# Patient Record
Sex: Female | Born: 1940 | Race: White | Hispanic: No | Marital: Married
Health system: Southern US, Community
[De-identification: ages and names within clinical notes are randomized; demographics above are authoritative.]

## PROBLEM LIST (undated history)

## (undated) DIAGNOSIS — Z91018 Allergy to other foods: Secondary | ICD-10-CM

## (undated) HISTORY — PX: BREAST BIOPSY: SHX20

---

## 2008-11-27 HISTORY — PX: BREAST BIOPSY: SHX20

## 2017-12-10 HISTORY — PX: BREAST BIOPSY: SHX20

## 2019-11-10 ENCOUNTER — Other Ambulatory Visit: Payer: Self-pay | Admitting: Internal Medicine

## 2019-11-10 DIAGNOSIS — Z1231 Encounter for screening mammogram for malignant neoplasm of breast: Secondary | ICD-10-CM

## 2019-11-30 ENCOUNTER — Ambulatory Visit
Admission: RE | Admit: 2019-11-30 | Discharge: 2019-11-30 | Disposition: A | Discharge status: Home / Self Care | Payer: Medicare PPO | Source: Ambulatory Visit | Attending: Internal Medicine | Admitting: Internal Medicine

## 2019-11-30 ENCOUNTER — Other Ambulatory Visit: Payer: Self-pay

## 2019-11-30 DIAGNOSIS — Z1231 Encounter for screening mammogram for malignant neoplasm of breast: Secondary | ICD-10-CM

## 2020-01-09 ENCOUNTER — Telehealth: Payer: Medicare PPO | Admitting: Family

## 2020-01-09 DIAGNOSIS — J069 Acute upper respiratory infection, unspecified: Secondary | ICD-10-CM | POA: Diagnosis not present

## 2020-01-09 MED ORDER — FLUTICASONE PROPIONATE 50 MCG/ACT NA SUSP: 2.0000 | Freq: Every day | NASAL | 6 refills | Status: AC

## 2020-01-09 NOTE — Progress Notes (Signed)

## 2020-10-30 ENCOUNTER — Other Ambulatory Visit: Payer: Self-pay | Admitting: Internal Medicine

## 2020-10-30 DIAGNOSIS — Z1231 Encounter for screening mammogram for malignant neoplasm of breast: Secondary | ICD-10-CM

## 2020-12-12 ENCOUNTER — Ambulatory Visit
Admission: RE | Admit: 2020-12-12 | Discharge: 2020-12-12 | Disposition: A | Discharge status: Home / Self Care | Payer: Medicare PPO | Source: Ambulatory Visit | Attending: Internal Medicine | Admitting: Internal Medicine

## 2020-12-12 ENCOUNTER — Other Ambulatory Visit: Payer: Self-pay

## 2020-12-12 DIAGNOSIS — Z1231 Encounter for screening mammogram for malignant neoplasm of breast: Secondary | ICD-10-CM | POA: Insufficient documentation

## 2021-11-06 IMAGING — MG MM DIGITAL SCREENING BILAT W/ TOMO AND CAD
8 series · 8 of 24 positions shown · non-contrast
Comparison: Previous exam(s).

CLINICAL DATA: Screening.

EXAM:
DIGITAL SCREENING BILATERAL MAMMOGRAM WITH TOMOSYNTHESIS AND CAD
TECHNIQUE: Bilateral screening digital craniocaudal and mediolateral oblique
mammograms were obtained. Bilateral screening digital breast
tomosynthesis was performed. The images were evaluated with
computer-aided detection.

[R CC synth-2D]
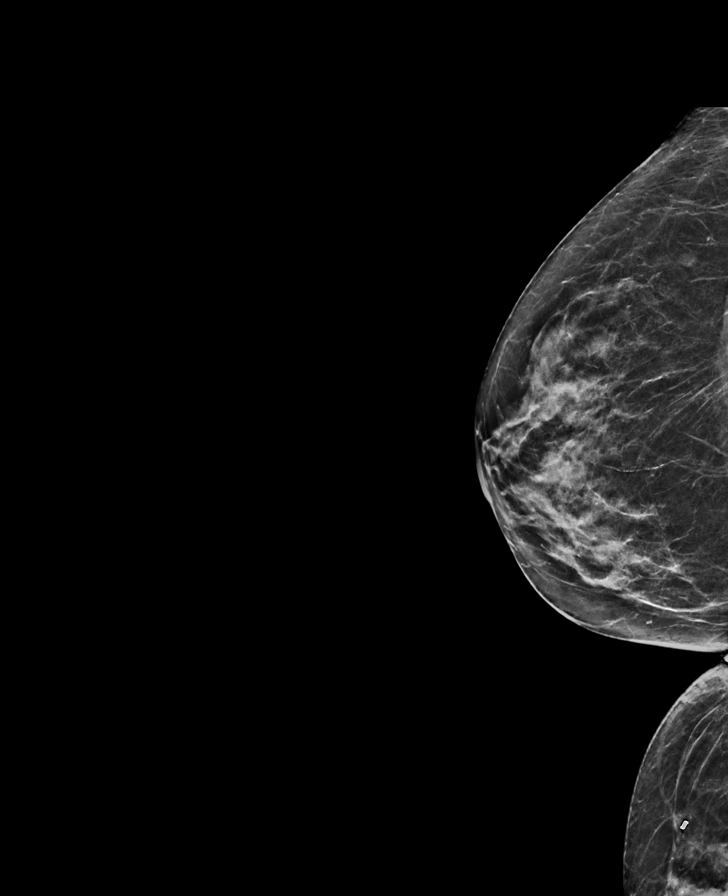

[L MLO synth-2D]
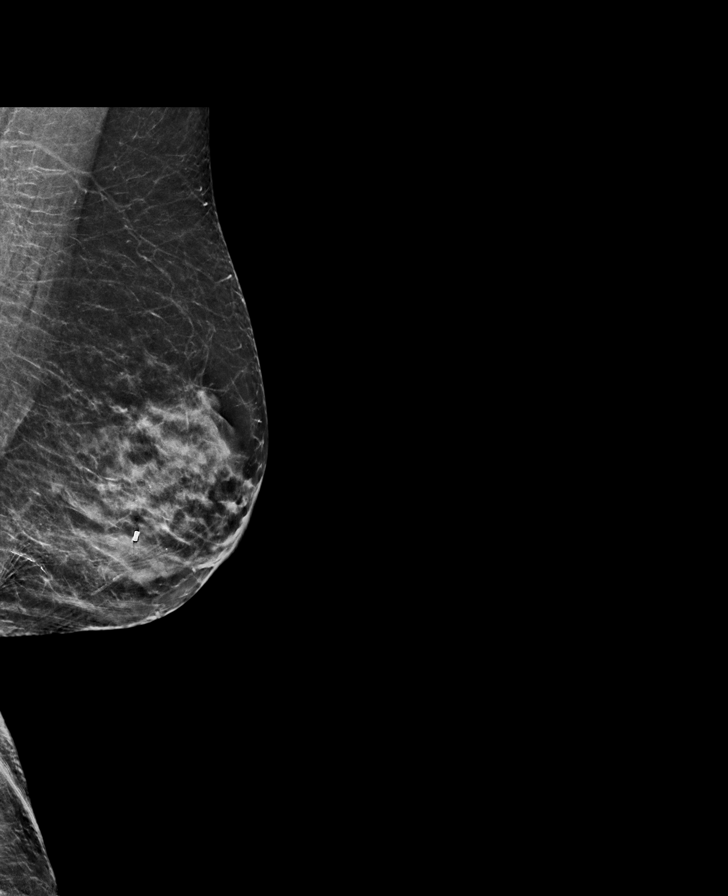

[R MLO synth-2D]
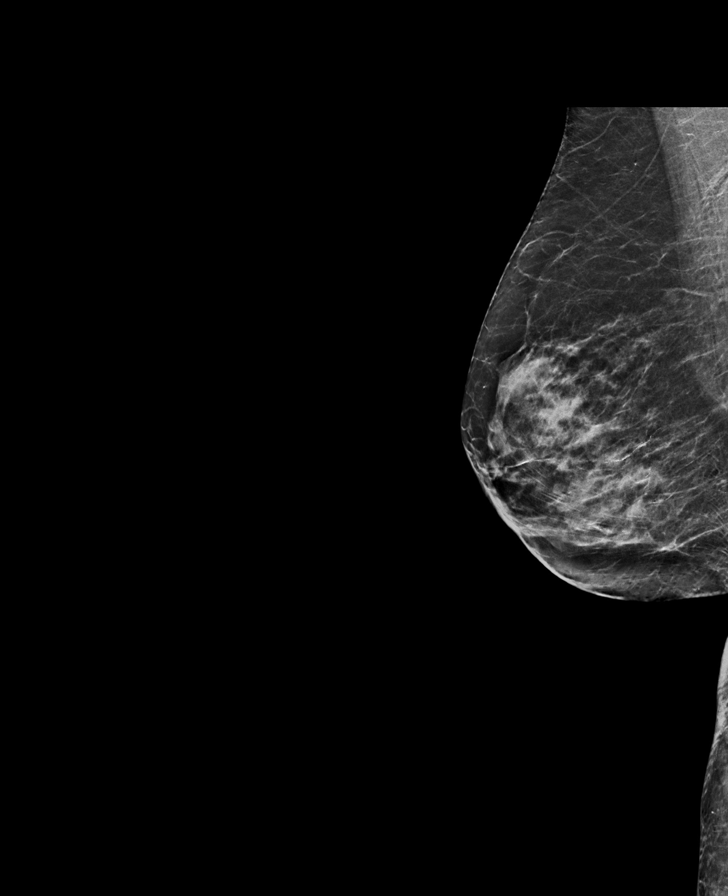

[L CC synth-2D]
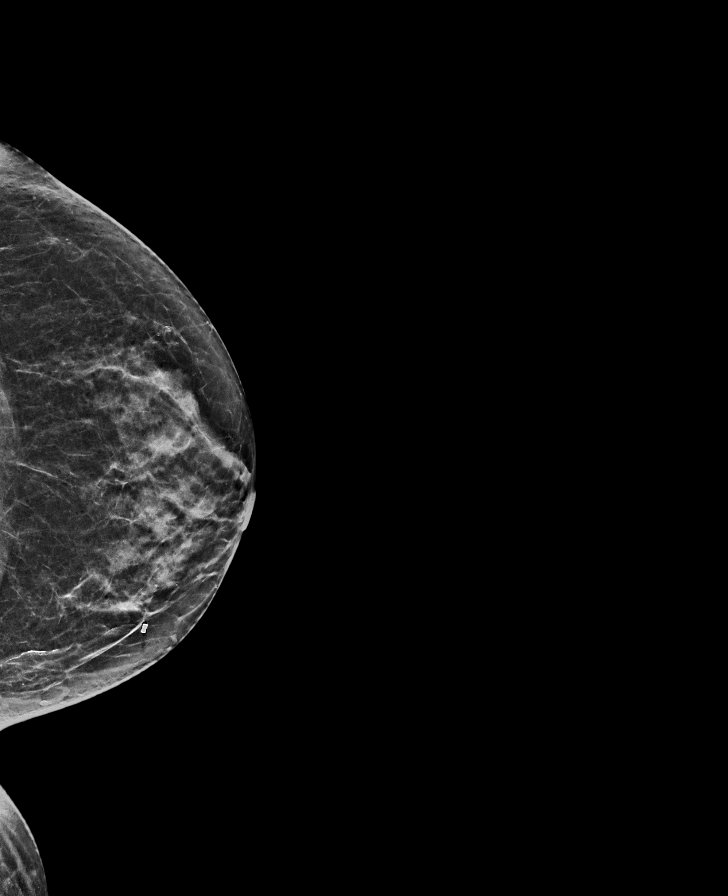

[L MLO tomo · tomo slice 31/62.0]
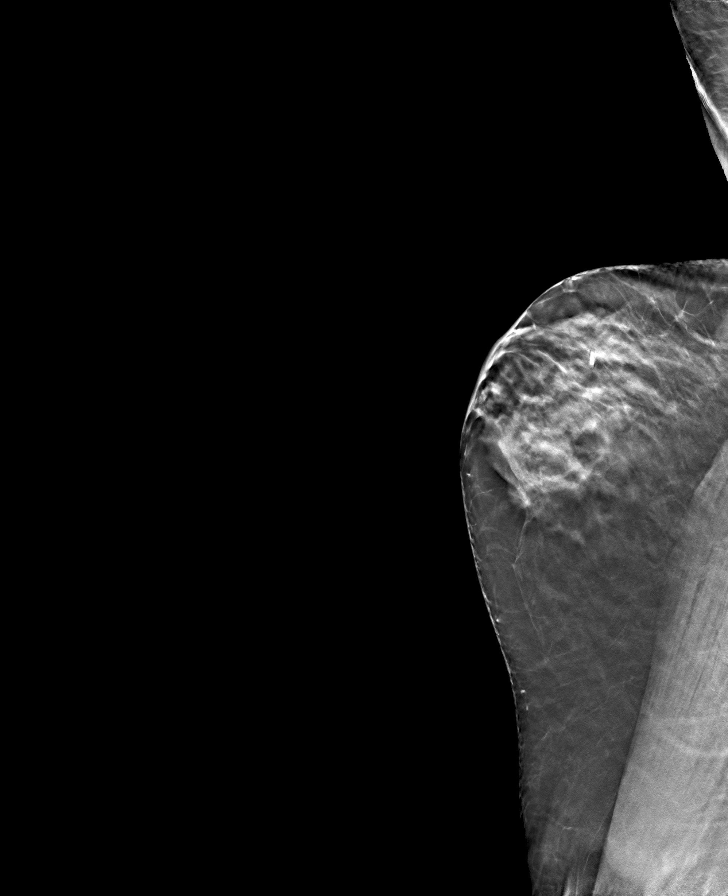

[R CC tomo · tomo slice 27/54.0]
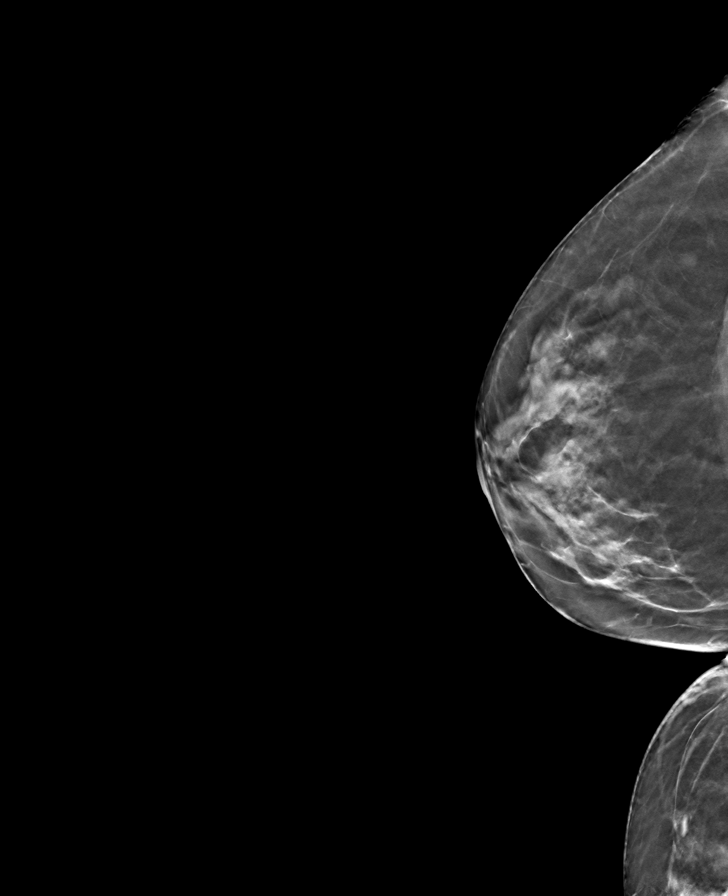

[L CC tomo · tomo slice 29/58.0]
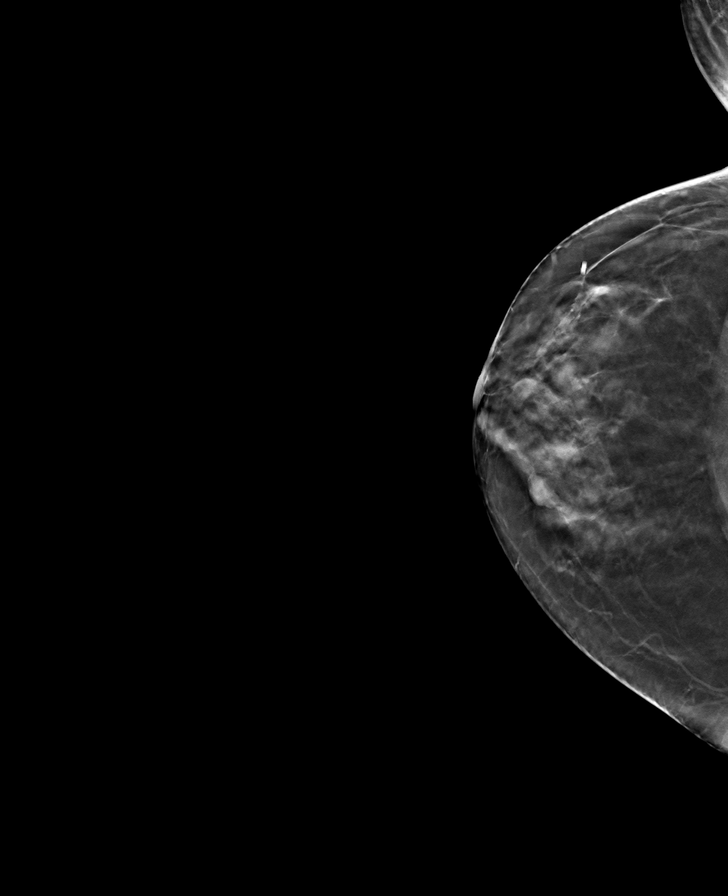

[R MLO tomo · tomo slice 29/58.0]
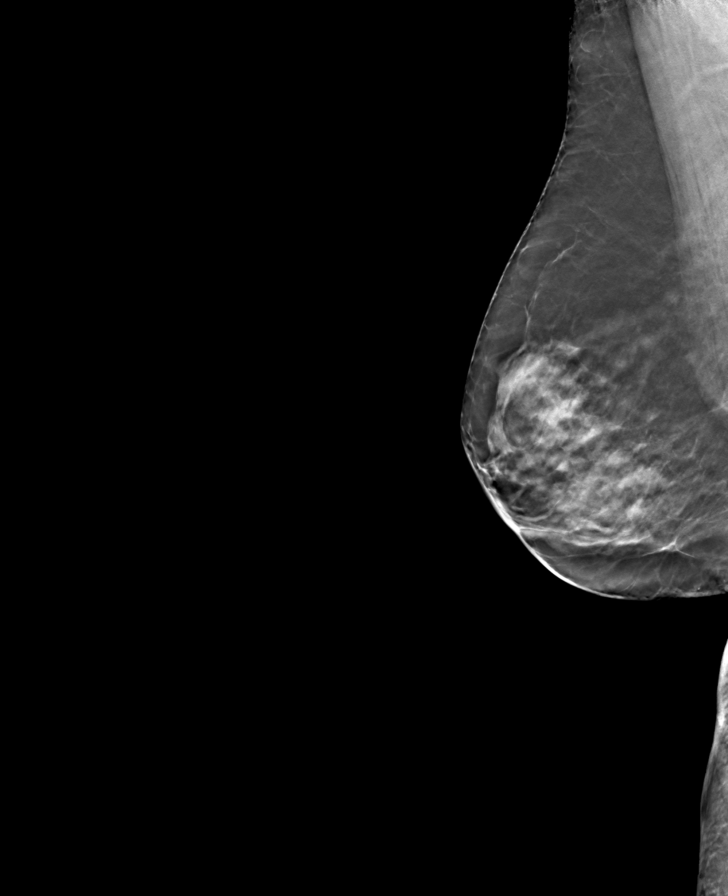

[8 of 24 positions shown; findings below may reference images not displayed]

ACR Breast Density Category c: The breast tissue is heterogeneously
dense, which may obscure small masses.
FINDINGS: There are no findings suspicious for malignancy.
IMPRESSION: No mammographic evidence of malignancy. A result letter of this
screening mammogram will be mailed directly to the patient.

RECOMMENDATION:
Screening mammogram in one year. (Code:Q3-W-BC3)

BI-RADS CATEGORY  1: Negative.

## 2021-12-14 ENCOUNTER — Other Ambulatory Visit: Payer: Self-pay | Admitting: Internal Medicine

## 2021-12-14 DIAGNOSIS — Z1231 Encounter for screening mammogram for malignant neoplasm of breast: Secondary | ICD-10-CM

## 2022-01-23 ENCOUNTER — Ambulatory Visit
Admission: RE | Admit: 2022-01-23 | Discharge: 2022-01-23 | Disposition: A | Discharge status: Home / Self Care | Payer: Medicare PPO | Source: Ambulatory Visit | Attending: Internal Medicine | Admitting: Internal Medicine

## 2022-01-23 DIAGNOSIS — Z1231 Encounter for screening mammogram for malignant neoplasm of breast: Secondary | ICD-10-CM | POA: Diagnosis present

## 2022-03-21 ENCOUNTER — Ambulatory Visit: Admit: 2022-03-21 | Payer: Medicare PPO | Admitting: Gastroenterology

## 2022-03-21 SURGERY — COLONOSCOPY
Anesthesia: General

## 2022-05-04 ENCOUNTER — Emergency Department: Payer: Medicare PPO

## 2022-05-04 ENCOUNTER — Other Ambulatory Visit: Payer: Self-pay

## 2022-05-04 ENCOUNTER — Encounter: Payer: Self-pay | Admitting: Emergency Medicine

## 2022-05-04 DIAGNOSIS — X58XXXA Exposure to other specified factors, initial encounter: Secondary | ICD-10-CM | POA: Insufficient documentation

## 2022-05-04 DIAGNOSIS — H538 Other visual disturbances: Secondary | ICD-10-CM | POA: Diagnosis present

## 2022-05-04 DIAGNOSIS — S0501XA Injury of conjunctiva and corneal abrasion without foreign body, right eye, initial encounter: Secondary | ICD-10-CM | POA: Insufficient documentation

## 2022-05-04 DIAGNOSIS — E039 Hypothyroidism, unspecified: Secondary | ICD-10-CM | POA: Insufficient documentation

## 2022-05-04 DIAGNOSIS — I10 Essential (primary) hypertension: Secondary | ICD-10-CM | POA: Insufficient documentation

## 2022-05-04 NOTE — ED Triage Notes (Signed)
Pt reports she was getting ready to go to bed, pt reports she suddenly she noticed a "gray bar on her right eye" Pt reports she closed her her and she noticed a bright light after image of the gray bar, pt called EMT from her facility and was encourage to come. Pt at present reports the gray bar has diminished but continues to to feel something is there, feels like her eye is not moving smoothly. Pt also reports BP was elevated when EMT checked her BP at facility. Pt talks in complete sentences no respiratory distress noted.

## 2022-05-04 NOTE — ED Notes (Signed)
Pt discussed with Dr. Quentin Cornwall.  No new orders or protocols at this time.

## 2022-05-05 ENCOUNTER — Emergency Department
Admission: EM | Admit: 2022-05-05 | Discharge: 2022-05-05 | Disposition: A | Discharge status: Home / Self Care | Payer: Medicare PPO | Attending: Emergency Medicine | Admitting: Emergency Medicine

## 2022-05-05 DIAGNOSIS — S0501XA Injury of conjunctiva and corneal abrasion without foreign body, right eye, initial encounter: Secondary | ICD-10-CM

## 2022-05-05 DIAGNOSIS — H539 Unspecified visual disturbance: Secondary | ICD-10-CM

## 2022-05-05 DIAGNOSIS — I10 Essential (primary) hypertension: Secondary | ICD-10-CM

## 2022-05-05 HISTORY — DX: Allergy to other foods: Z91.018

## 2022-05-05 LAB — CBC WITH DIFFERENTIAL/PLATELET
Abs Immature Granulocytes: 0.01 10*3/uL (ref 0.00–0.07)
Basophils Absolute: 0.1 10*3/uL (ref 0.0–0.1)
Basophils Relative: 1 %
Eosinophils Absolute: 0.1 10*3/uL (ref 0.0–0.5)
Eosinophils Relative: 2 %
HCT: 40.8 % (ref 36.0–46.0)
Hemoglobin: 13.6 g/dL (ref 12.0–15.0)
Immature Granulocytes: 0 %
Lymphocytes Relative: 40 %
Lymphs Abs: 2.5 10*3/uL (ref 0.7–4.0)
MCH: 31.8 pg (ref 26.0–34.0)
MCHC: 33.3 g/dL (ref 30.0–36.0)
MCV: 95.3 fL (ref 80.0–100.0)
Monocytes Absolute: 0.5 10*3/uL (ref 0.1–1.0)
Monocytes Relative: 8 %
Neutro Abs: 3.1 10*3/uL (ref 1.7–7.7)
Neutrophils Relative %: 49 %
Platelets: 248 10*3/uL (ref 150–400)
RBC: 4.28 MIL/uL (ref 3.87–5.11)
RDW: 12.6 % (ref 11.5–15.5)
WBC: 6.2 10*3/uL (ref 4.0–10.5)
nRBC: 0 % (ref 0.0–0.2)

## 2022-05-05 LAB — BASIC METABOLIC PANEL
Anion gap: 7 (ref 5–15)
BUN: 28 mg/dL — ABNORMAL HIGH (ref 8–23)
CO2: 26 mmol/L (ref 22–32)
Calcium: 9.3 mg/dL (ref 8.9–10.3)
Chloride: 105 mmol/L (ref 98–111)
Creatinine, Ser: 0.74 mg/dL (ref 0.44–1.00)
GFR, Estimated: >60 mL/min (ref >60)
Glucose, Bld: 96 mg/dL (ref 70–99)
Potassium: 3.7 mmol/L (ref 3.5–5.1)
Sodium: 138 mmol/L (ref 135–145)

## 2022-05-05 MED ORDER — POLYMYXIN B-TRIMETHOPRIM 10000-0.1 UNIT/ML-% OP SOLN: 1.0000 [drp] | Freq: Four times a day (QID) | OPHTHALMIC | 0 refills | Status: AC

## 2022-05-05 MED ORDER — FLUORESCEIN SODIUM 1 MG OP STRP
1.0000 | ORAL_STRIP | Freq: Once | OPHTHALMIC | Status: AC
  Administered 2022-05-05: 1 via OPHTHALMIC
  Filled 2022-05-05: qty 1

## 2022-05-05 MED ORDER — TETRACAINE HCL 0.5 % OP SOLN
2.0000 [drp] | Freq: Once | OPHTHALMIC | Status: AC
  Administered 2022-05-05: 2 [drp] via OPHTHALMIC
  Filled 2022-05-05: qty 4

## 2022-05-05 NOTE — Discharge Instructions (Addendum)
I recommend follow-up with your ophthalmologist on Monday.

## 2022-05-05 NOTE — ED Provider Notes (Signed)
[facility] Provider Note    Event Date/Time   First MD Initiated Contact with Patient 05/05/22 [phone]     (approximate)   History   Eye Problem   HPI  Sierra Sawyer is a 82 y.o. female with history of hypothyroidism who presents to the emergency department with concerns for changes in her vision in her right eye that occurred tonight around 9:15 PM right before going to bed.  States she just got up from using the bathroom when she suddenly had a "gray bar" across the center of her vision in her right eye only.  She states when she closed her eyes that this seemed to be more vibrant.  She states she was able to see through the gray bar but it appeared blurry.  There was no vision loss or eye pain.  She states this only lasted 15 to 20 seconds and then resolved.  She does wear glasses.  She does not wear contacts.  She states that afterwards she felt like she had a fullness in the right eye whenever she closes it which is abnormal for her.  She does not think that she injured her eye or rubbed it during this episode.  She denies any headache or head injury.  She states she became concerned for retinal detachment after googling it.  She was also concern for possible stroke.  She denies any numbness, tingling, weakness, other speech or vision changes.   History provided by patient.    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 Authorizing Provider  fluticasone (FLONASE) 50 MCG/ACT nasal spray Place 2 sprays into both nostrils daily. 01/09/20   Sharion Balloon, FNP    Physical Exam   Triage Vital Signs: ED Triage Vitals [05/04/22 2226]  Enc Vitals Group     BP (!) 170/81      Pulse Rate 79     Resp 16     Temp 98.5 F (36.9 C)     Temp Source Oral     SpO2 100 %     Weight 124 lb (56.2 kg)     Height 4\' 11"  (1.499 m)     Head Circumference      Peak Flow      Pain Score 0     Pain Loc      Pain Edu?      Excl. in [place]?     Most recent vital signs: Vitals:   05/05/22 0300 05/05/22 0430  BP: (!) 163/79 135/69  Pulse: 66 73  Resp: 18   Temp: 97.9 F (36.6 C) 98.4 F (36.9 C)  SpO2: 100% 96%    CONSTITUTIONAL: Alert, responds appropriately to questions. Well-appearing; well-nourished HEAD: Normocephalic, atraumatic EYES: Conjunctivae clear, pupils appear equal, sclera nonicteric, intraocular pressure of the right eye is 9 mmHg, patient does have fluorescein uptake just inferior lateral to the pupil at about the 7 o'clock position of the right eye, no sign of corneal ulcer.  Extraocular movements intact.  Pupils equal reactive to light bilaterally and no consensual light response.  Funduscopic exam limited due to patient's eyes not currently being dilated but no acute abnormality appreciated.  Normal visual fields  bilaterally.  Please see nursing notes for visual acuity. ENT: normal nose; moist mucous membranes NECK: Supple, normal ROM CARD: RRR; S1 and S2 appreciated RESP: Normal chest excursion without splinting or tachypnea; breath sounds clear and equal bilaterally; no wheezes, no rhonchi, no rales, no hypoxia or respiratory distress, speaking full sentences ABD/GI: Non-distended; soft, non-tender, no rebound, no guarding, no peritoneal signs BACK: The back appears normal EXT: Normal ROM in all joints; no deformity noted, no edema SKIN: Normal color for age and race; warm; no rash on exposed skin NEURO: Moves all extremities equally, normal speech normal sensation, normal gait, no facial asymmetry PSYCH: The patient's mood and manner are appropriate.   ED Results / Procedures / Treatments   LABS: (all labs ordered are listed, but only abnormal  results are displayed) Labs Reviewed  BASIC METABOLIC PANEL - Abnormal; Notable for the following components:      Result Value   BUN 28 (*)    All other components within normal limits  CBC WITH DIFFERENTIAL/PLATELET     EKG:     RADIOLOGY: My personal review and interpretation of imaging: CT head shows no acute abnormality.  I have personally reviewed all radiology reports.   CT HEAD WO CONTRAST (5MM)  Result Date: 05/05/2022 CLINICAL DATA:  Vision changes EXAM: CT HEAD WITHOUT CONTRAST TECHNIQUE: Contiguous axial images were obtained from the base of the skull through the vertex without intravenous contrast. RADIATION DOSE REDUCTION: This exam was performed according to the departmental dose-optimization program which includes automated exposure control, adjustment of the mA and/or kV according to patient size and/or use of iterative reconstruction technique. COMPARISON:  None Available. FINDINGS: Brain: There is no mass, hemorrhage or extra-axial collection. The size and configuration of the ventricles and extra-axial CSF spaces are normal. There is hypoattenuation of the white matter, most commonly indicating chronic small vessel disease. Old right cerebellar infarct. Vascular: No abnormal hyperdensity of the major intracranial arteries or dural venous sinuses. No intracranial atherosclerosis. Skull: The visualized skull base, calvarium and extracranial soft tissues are normal. Sinuses/Orbits: No fluid levels or advanced mucosal thickening of the visualized paranasal sinuses. No mastoid or middle ear effusion. The orbits are normal. IMPRESSION: 1. No acute intracranial abnormality. 2. 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 Electronically Signed   By: Ulyses Jarred M.D.   On: 05/05/2022 00:01     PROCEDURES:  Critical Care performed: No    Procedures    IMPRESSION / MDM / ASSESSMENT AND PLAN / ED COURSE  I reviewed the triage vital signs and  the nursing notes.    Patient here with brief episode of vision changes in the right eye only that has resolved.  The patient is on the cardiac monitor to evaluate for evidence of arrhythmia and/or significant heart rate changes.   DIFFERENTIAL DIAGNOSIS (includes but not limited to):   Retinal detachment, CRVO, CRAO, corneal abrasion, no signs of iritis, endophthalmitis, globe injury   Patient's presentation is most consistent with acute presentation with potential threat to life or bodily function.   PLAN: Workup initiated from triage.  Normal hemoglobin, electrolytes, renal function.  CT head reviewed and interpreted by myself and the radiologist and shows no acute abnormality.  Currently her vision is back to her baseline.  She has normal visual fields.  Funduscopic exam limited as patient's eyes are currently dilated but given she is not having any visual changes I do not feel that this is emergently necessary and  she can follow-up with an ophthalmologist as an outpatient.  Intraocular pressure of her eye is normal.  She does have a small corneal abrasion to her eye which could be the cause of the "fullness" that she sees.  Discussed with patient that this seems very atypical for CRAO given this was not an episode of vision loss but does seem to be more likely related to her retina.  She is not having any headache or any other focal neurologic deficits today.  Her head CT does not show anything acute.  I feel she is safe for discharge home and she is also comfortable with this plan.  Will discharge with Polytrim drops for the next 5 days given her corneal abrasion and have given her ophthalmology follow-up on-call for [place] but have also discussed with her she can follow-up with her ophthalmologist at [facility] clinic.   MEDICATIONS GIVEN IN ED: Medications  fluorescein ophthalmic strip 1 strip (1 strip Right Eye Given by Other 05/05/22 0416)  tetracaine (PONTOCAINE) 0.5 % ophthalmic solution 2  drop (2 drops Right Eye Given by Other 05/05/22 0414)     ED COURSE:  At this time, I do not feel there is any life-threatening condition present. I reviewed all nursing notes, vitals, pertinent previous records.  All lab and urine results, EKGs, imaging ordered have been independently reviewed and interpreted by myself.  I reviewed all available radiology reports from any imaging ordered this visit.  Based on my assessment, I feel the patient is safe to be discharged home without further emergent workup and can continue workup as an outpatient as needed. Discussed all findings, treatment plan as well as usual and customary return precautions.  They verbalize understanding and are comfortable with this plan.  Outpatient follow-up has been provided as needed.  All questions have been answered.    CONSULTS:  none   OUTSIDE RECORDS REVIEWED: Reviewed last PCP note in January 2024.       FINAL CLINICAL IMPRESSION(S) / ED DIAGNOSES   Final diagnoses:  Vision changes  Hypertension, unspecified type  Abrasion of right cornea, initial encounter     Rx / DC Orders   ED Discharge Orders          Ordered    trimethoprim-polymyxin b (POLYTRIM) ophthalmic solution  Every 6 hours        05/05/22 0408             Note:  This document was prepared using Dragon voice recognition software and may include unintentional dictation errors.   Georgi Navarrete, Delice Bison, DO 05/05/22 [phone]

## 2023-01-07 ENCOUNTER — Other Ambulatory Visit: Payer: Self-pay | Admitting: Internal Medicine

## 2023-01-07 DIAGNOSIS — Z1231 Encounter for screening mammogram for malignant neoplasm of breast: Secondary | ICD-10-CM

## 2023-01-24 ENCOUNTER — Other Ambulatory Visit: Payer: Self-pay | Admitting: Medical Genetics

## 2023-01-28 ENCOUNTER — Other Ambulatory Visit
Admission: RE | Admit: 2023-01-28 | Discharge: 2023-01-28 | Disposition: A | Discharge status: Home / Self Care | Payer: Self-pay | Source: Ambulatory Visit | Attending: Medical Genetics | Admitting: Medical Genetics

## 2023-02-08 LAB — GENECONNECT MOLECULAR SCREEN: Genetic Analysis Overall Interpretation: NEGATIVE

## 2023-02-20 ENCOUNTER — Ambulatory Visit
Admission: RE | Admit: 2023-02-20 | Discharge: 2023-02-20 | Disposition: A | Discharge status: Home / Self Care | Payer: Medicare PPO | Source: Ambulatory Visit | Attending: Internal Medicine | Admitting: Internal Medicine

## 2023-02-20 DIAGNOSIS — Z1231 Encounter for screening mammogram for malignant neoplasm of breast: Secondary | ICD-10-CM | POA: Diagnosis present
